# Patient Record
Sex: Male | Born: 1950 | Race: White | Hispanic: No | Marital: Married
Health system: Southern US, Community
[De-identification: ages and names within clinical notes are randomized; demographics above are authoritative.]

---

## 2016-01-07 ENCOUNTER — Ambulatory Visit
Admission: RE | Admit: 2016-01-07 | Discharge: 2016-01-07 | Disposition: A | Discharge status: Home / Self Care | Payer: BLUE CROSS/BLUE SHIELD | Source: Ambulatory Visit | Attending: Family Medicine | Admitting: Family Medicine

## 2016-01-07 ENCOUNTER — Other Ambulatory Visit: Payer: Self-pay | Admitting: Family Medicine

## 2016-01-07 DIAGNOSIS — R29810 Facial weakness: Secondary | ICD-10-CM

## 2017-03-10 IMAGING — CT CT HEAD W/O CM
3 of 4 series · 17 of 47 positions shown, 20 images · non-contrast
Comparison: None.

CLINICAL DATA: Left-sided facial droop this morning. Watery eyes.
Headache yesterday.

EXAM:
CT HEAD WITHOUT CONTRAST
TECHNIQUE: Contiguous axial images were obtained from the base of the skull
through the vertex without intravenous contrast.

[Series 32: 3d filtered head w/o · axial · non-contrast · 0.49mm/px · z∈[-5,+125]mm · 11 of 32 slices shown, 14 images]
[im 3/32  brain]
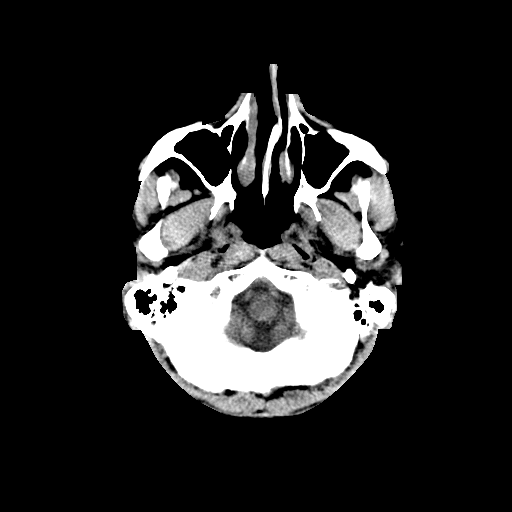
[im 3/32  bone]
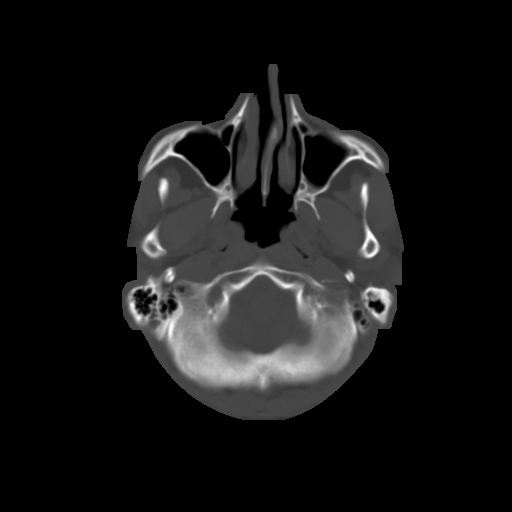
[im 5/32  brain]
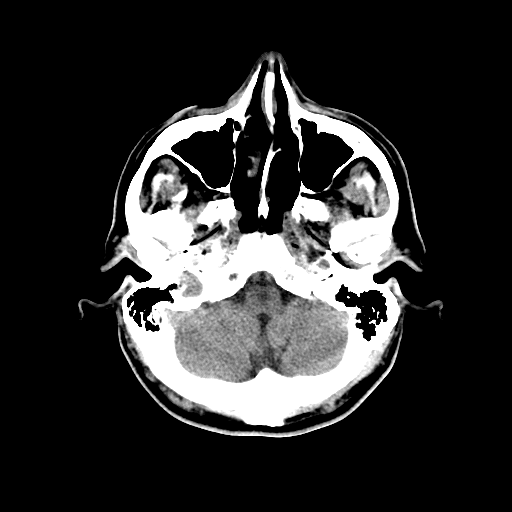
[im 7/32  brain]
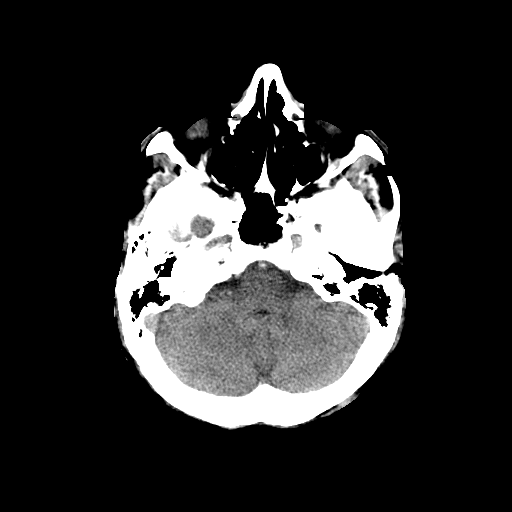
[im 12/32  brain]
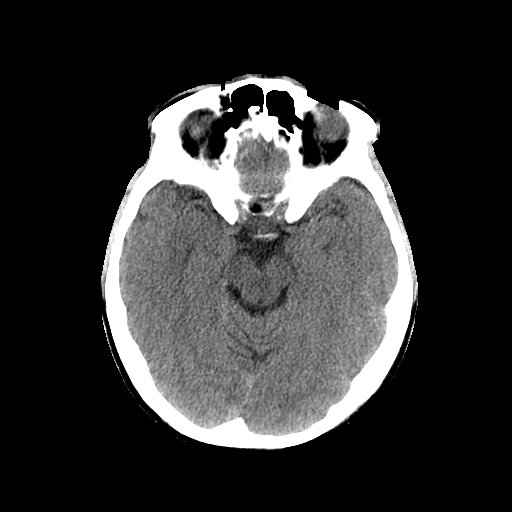
[im 14/32  brain]
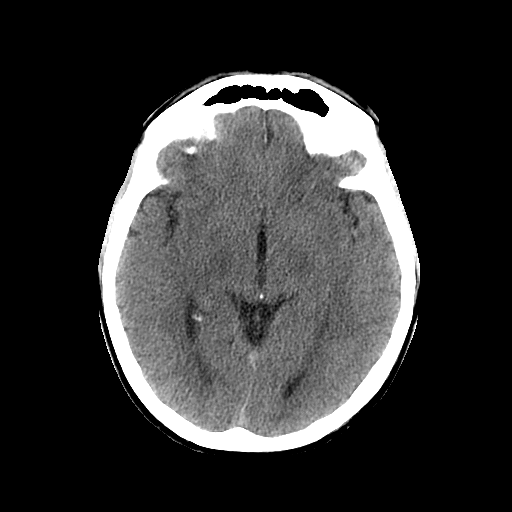
[im 14/32  bone]
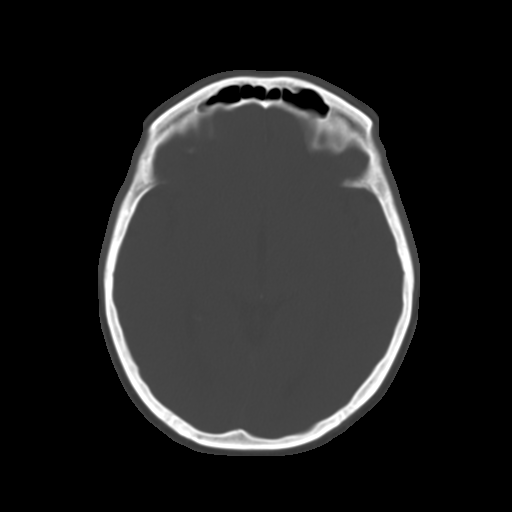
[im 16/32  brain]
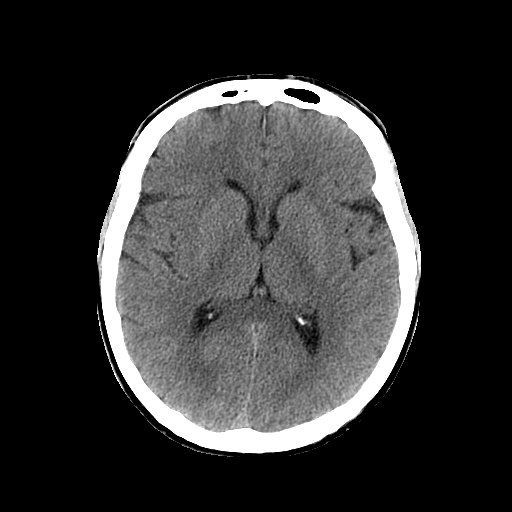
[im 18/32  brain]
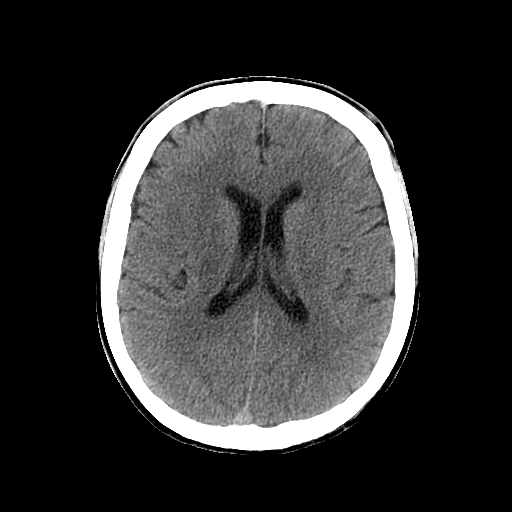
[im 20/32  brain]
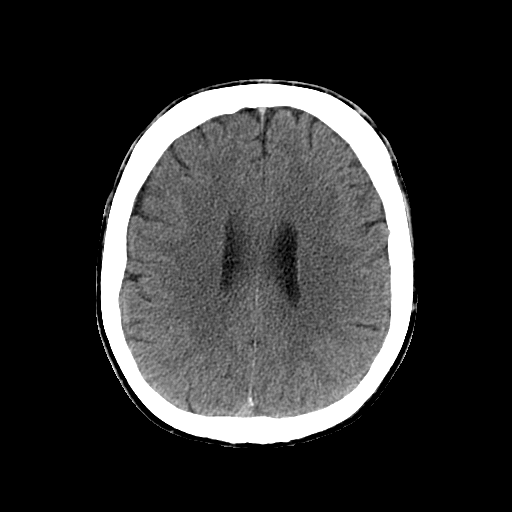
[im 25/32  brain]
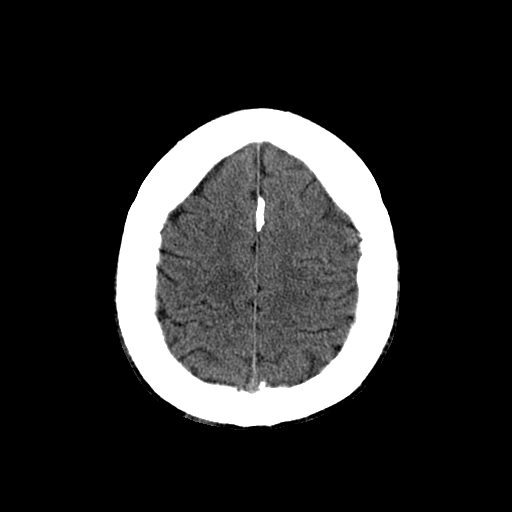
[im 25/32  bone]
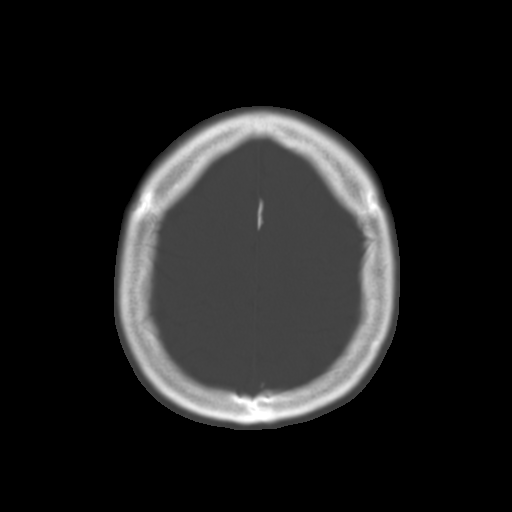
[im 27/32  brain]
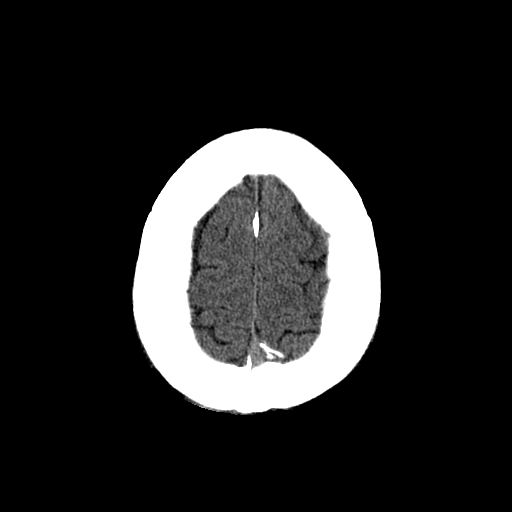
[im 29/32  brain]
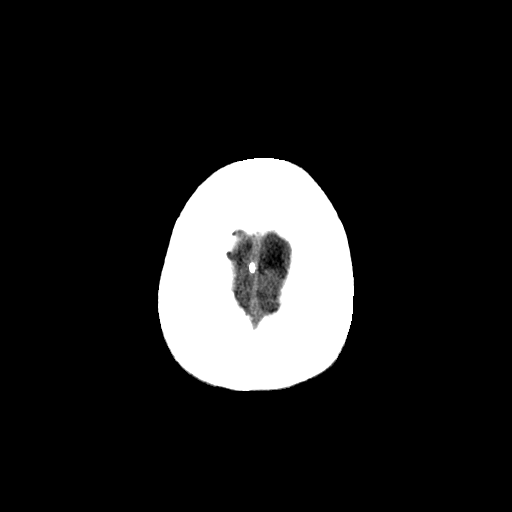

[Series 601: coronal brain · coronal · 0.49mm/px · 3 of 71 slices shown]
[im 24/71  brain]
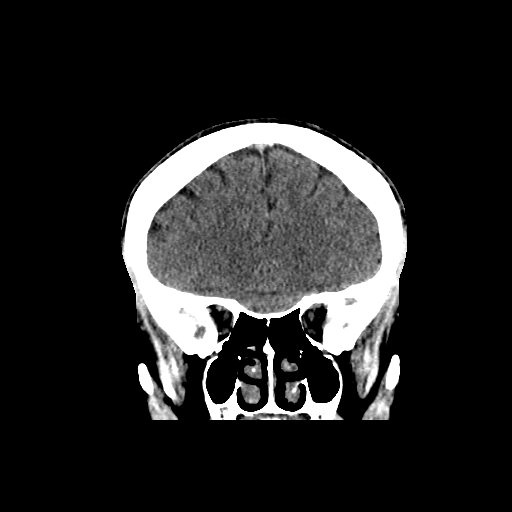
[im 32/71  brain]
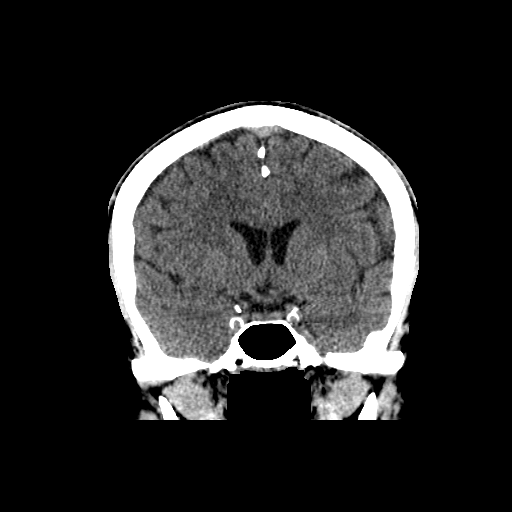
[im 39/71  brain]
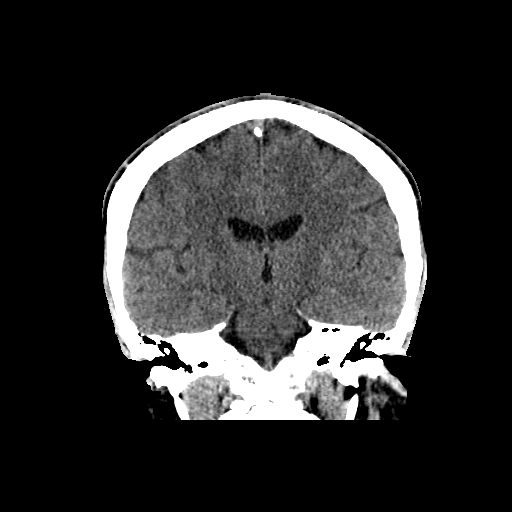

[Series 602: sagittal brain · sagittal · 0.49mm/px · 3 of 65 slices shown]
[im 22/65  brain]
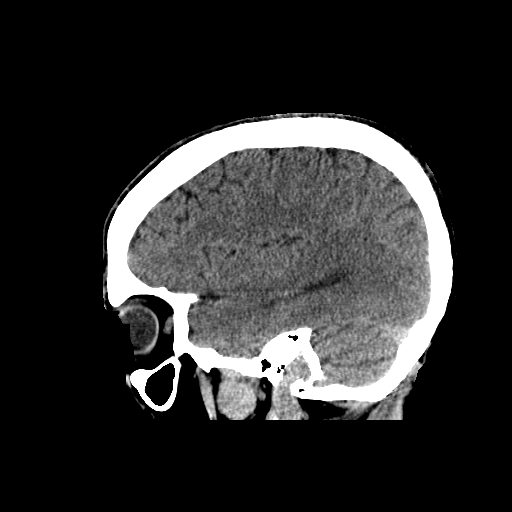
[im 33/65  brain]
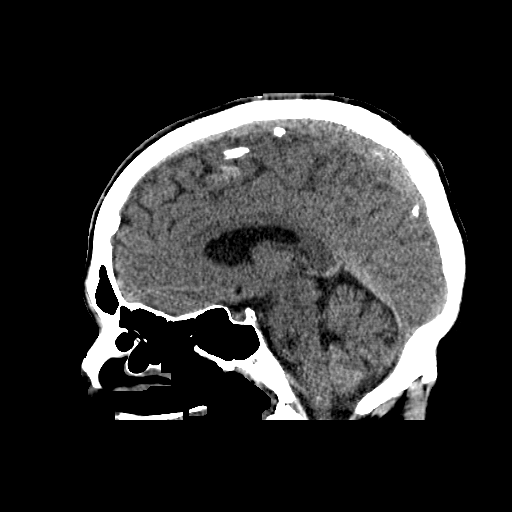
[im 43/65  brain]
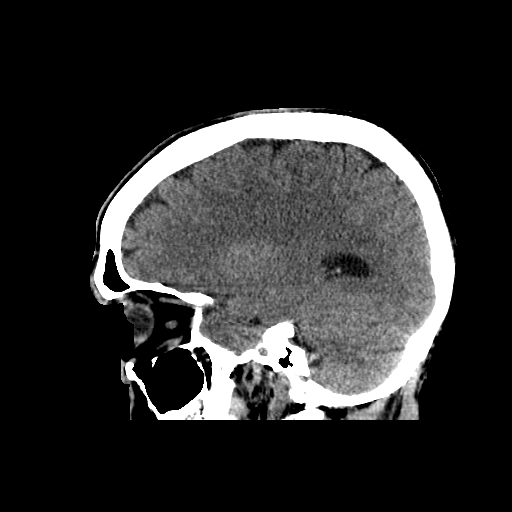

[17 of 47 positions shown; findings below may reference images not displayed]

FINDINGS: Brain: No mass lesion, hemorrhage, hydrocephalus, acute infarct,
intra-axial, or extra-axial fluid collection.

Vascular: No hyperdense vessel or unexpected calcification.

Skull: Within normal limits.

Sinuses/Orbits: Normal imaged orbits and globes. Clear paranasal
sinuses and mastoid air cells.

Other: None
IMPRESSION: Normal head CT for age.  No explanation for patient's symptoms.

## 2019-07-25 ENCOUNTER — Ambulatory Visit: Payer: Medicare Other | Attending: Internal Medicine

## 2019-07-25 DIAGNOSIS — Z23 Encounter for immunization: Secondary | ICD-10-CM | POA: Insufficient documentation

## 2019-07-25 NOTE — Progress Notes (Signed)
   [facility] Vacc[facility]  Name:  Lyell Clugston    MRN: [id number] DOB: November 09, 1950  07/25/2019  Mr. Kyser was observed post Covid-19 immunization for 15 minutes without incident. He was provided with Vaccine Information Sheet and instruction to access the V-Safe system.   Mr. Iten was instructed to call 911 with any severe reactions post vaccine: Marland Kitchen Difficulty breathing  . Swelling of face and throat  . A fast heartbeat  . A bad rash all over body  . Dizziness and weakness   Immunizations Administered    Name Date Dose VIS Date Route   Pfizer COVID-19 Vaccine 07/25/2019  5:08 PM 0.3 mL 04/29/2019 Intramuscular   Manufacturer: [employer], [employer]   Lot: [id number]   NDC: [address]-8

## 2019-08-24 ENCOUNTER — Ambulatory Visit: Payer: Medicare Other | Attending: Internal Medicine

## 2019-08-24 DIAGNOSIS — Z23 Encounter for immunization: Secondary | ICD-10-CM

## 2019-08-24 NOTE — Progress Notes (Signed)
   Covid-19 Vaccination Clinic  Name:  Peter Clayton    MRN: [id number] DOB: 1950/08/02  08/24/2019  [name] was observed post Covid-19 immunization for 15 minutes without incident. He was provided with Vaccine Information Sheet and instruction to access the V-Safe system.   [name] was instructed to call 911 with any severe reactions post vaccine: Marland Kitchen Difficulty breathing  . Swelling of face and throat  . A fast heartbeat  . A bad rash all over body  . Dizziness and weakness   Immunizations Administered    Name Date Dose VIS Date Route   Pfizer COVID-19 Vaccine 08/24/2019  3:12 PM 0.3 mL 04/29/2019 Intramuscular   Manufacturer: [employer], [employer]   Lot: [id number]   NDC: [address]-5
